# Patient Record
Sex: Female | Born: 1955
Health system: Southern US, Community
[De-identification: ages and names within clinical notes are randomized; demographics above are authoritative.]

## PROBLEM LIST (undated history)

## (undated) DIAGNOSIS — I82409 Acute embolism and thrombosis of unspecified deep veins of unspecified lower extremity: Secondary | ICD-10-CM

## (undated) DIAGNOSIS — K5792 Diverticulitis of intestine, part unspecified, without perforation or abscess without bleeding: Secondary | ICD-10-CM

## (undated) DIAGNOSIS — M1990 Unspecified osteoarthritis, unspecified site: Secondary | ICD-10-CM

## (undated) DIAGNOSIS — J309 Allergic rhinitis, unspecified: Secondary | ICD-10-CM

## (undated) DIAGNOSIS — E559 Vitamin D deficiency, unspecified: Secondary | ICD-10-CM

## (undated) DIAGNOSIS — Z9049 Acquired absence of other specified parts of digestive tract: Secondary | ICD-10-CM

## (undated) DIAGNOSIS — Z860100 Personal history of colon polyps, unspecified: Secondary | ICD-10-CM

## (undated) DIAGNOSIS — Z6841 Body Mass Index (BMI) 40.0 and over, adult: Secondary | ICD-10-CM

## (undated) DIAGNOSIS — C55 Malignant neoplasm of uterus, part unspecified: Secondary | ICD-10-CM

## (undated) DIAGNOSIS — J0100 Acute maxillary sinusitis, unspecified: Secondary | ICD-10-CM

## (undated) DIAGNOSIS — C541 Malignant neoplasm of endometrium: Secondary | ICD-10-CM

## (undated) DIAGNOSIS — E7800 Pure hypercholesterolemia, unspecified: Secondary | ICD-10-CM

## (undated) DIAGNOSIS — J45909 Unspecified asthma, uncomplicated: Secondary | ICD-10-CM

## (undated) DIAGNOSIS — M51369 Other intervertebral disc degeneration, lumbar region without mention of lumbar back pain or lower extremity pain: Secondary | ICD-10-CM

## (undated) DIAGNOSIS — M169 Osteoarthritis of hip, unspecified: Secondary | ICD-10-CM

## (undated) DIAGNOSIS — Z91018 Allergy to other foods: Secondary | ICD-10-CM

## (undated) DIAGNOSIS — M5136 Other intervertebral disc degeneration, lumbar region: Secondary | ICD-10-CM

## (undated) DIAGNOSIS — L989 Disorder of the skin and subcutaneous tissue, unspecified: Secondary | ICD-10-CM

## (undated) DIAGNOSIS — E785 Hyperlipidemia, unspecified: Secondary | ICD-10-CM

## (undated) DIAGNOSIS — G4730 Sleep apnea, unspecified: Secondary | ICD-10-CM

## (undated) HISTORY — DX: Malignant neoplasm of endometrium: C54.1

## (undated) HISTORY — PX: OVARIAN CYST SURGERY: SHX726

## (undated) HISTORY — DX: Vitamin D deficiency, unspecified: E55.9

## (undated) HISTORY — DX: Unspecified osteoarthritis, unspecified site: M19.90

## (undated) HISTORY — DX: Unspecified asthma, uncomplicated: J45.909

## (undated) HISTORY — DX: Pure hypercholesterolemia, unspecified: E78.00

## (undated) HISTORY — DX: Acquired absence of other specified parts of digestive tract: Z90.49

## (undated) HISTORY — DX: Disorder of the skin and subcutaneous tissue, unspecified: L98.9

## (undated) HISTORY — DX: Allergy to other foods: Z91.018

## (undated) HISTORY — PX: OTHER SURGICAL HISTORY: SHX169

## (undated) HISTORY — DX: Malignant neoplasm of uterus, part unspecified: C55

## (undated) HISTORY — PX: CHOLECYSTECTOMY: SHX55

## (undated) HISTORY — DX: Other intervertebral disc degeneration, lumbar region: M51.36

## (undated) HISTORY — DX: Osteoarthritis of hip, unspecified: M16.9

## (undated) HISTORY — DX: Body Mass Index (BMI) 40.0 and over, adult: Z684

## (undated) HISTORY — PX: TUBAL LIGATION: SHX77

## (undated) HISTORY — DX: Other intervertebral disc degeneration, lumbar region without mention of lumbar back pain or lower extremity pain: M51.369

## (undated) HISTORY — PX: SHOULDER SURGERY: SHX246

## (undated) HISTORY — PX: ROBOTIC ASSISTED TOTAL HYSTERECTOMY: SHX6085

## (undated) HISTORY — DX: Personal history of colonic polyps: Z86.010

## (undated) HISTORY — PX: APPENDECTOMY: SHX54

## (undated) HISTORY — DX: Acute embolism and thrombosis of unspecified deep veins of unspecified lower extremity: I82.409

## (undated) HISTORY — DX: Diverticulitis of intestine, part unspecified, without perforation or abscess without bleeding: K57.92

## (undated) HISTORY — DX: Acute maxillary sinusitis, unspecified: J01.00

## (undated) HISTORY — PX: COLON SURGERY: SHX602

## (undated) HISTORY — DX: Allergic rhinitis, unspecified: J30.9

## (undated) HISTORY — DX: Personal history of colon polyps, unspecified: Z86.0100

---
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
## 2015-07-04 DIAGNOSIS — Z8542 Personal history of malignant neoplasm of other parts of uterus: Secondary | ICD-10-CM | POA: Insufficient documentation

## 2015-07-17 ENCOUNTER — Other Ambulatory Visit: Payer: Self-pay

## 2015-07-17 DIAGNOSIS — Z1231 Encounter for screening mammogram for malignant neoplasm of breast: Secondary | ICD-10-CM

## 2015-07-19 ENCOUNTER — Ambulatory Visit
Admission: RE | Admit: 2015-07-19 | Discharge: 2015-07-19 | Disposition: A | Discharge status: Home / Self Care | Payer: BLUE CROSS/BLUE SHIELD | Source: Ambulatory Visit | Attending: Physician Assistant | Admitting: Physician Assistant

## 2015-07-19 ENCOUNTER — Other Ambulatory Visit: Payer: Self-pay | Admitting: Physician Assistant

## 2015-07-19 DIAGNOSIS — M1612 Unilateral primary osteoarthritis, left hip: Secondary | ICD-10-CM

## 2015-07-31 ENCOUNTER — Ambulatory Visit
Admission: RE | Admit: 2015-07-31 | Discharge: 2015-07-31 | Disposition: A | Discharge status: Home / Self Care | Payer: BLUE CROSS/BLUE SHIELD | Source: Ambulatory Visit

## 2015-07-31 DIAGNOSIS — Z1231 Encounter for screening mammogram for malignant neoplasm of breast: Secondary | ICD-10-CM

## 2015-12-07 ENCOUNTER — Other Ambulatory Visit: Payer: Self-pay | Admitting: Physical Medicine and Rehabilitation

## 2015-12-07 DIAGNOSIS — M25552 Pain in left hip: Secondary | ICD-10-CM

## 2015-12-15 ENCOUNTER — Ambulatory Visit
Admission: RE | Admit: 2015-12-15 | Discharge: 2015-12-15 | Disposition: A | Discharge status: Home / Self Care | Payer: BLUE CROSS/BLUE SHIELD | Source: Ambulatory Visit | Attending: Physical Medicine and Rehabilitation | Admitting: Physical Medicine and Rehabilitation

## 2015-12-15 DIAGNOSIS — M25552 Pain in left hip: Secondary | ICD-10-CM

## 2015-12-15 MED ORDER — METHYLPREDNISOLONE ACETATE 40 MG/ML INJ SUSP (RADIOLOG
120.0000 mg | Freq: Once | INTRAMUSCULAR | Status: AC
  Administered 2015-12-15: 120 mg via INTRA_ARTICULAR

## 2015-12-15 MED ORDER — IOPAMIDOL (ISOVUE-M 200) INJECTION 41%
1.0000 mL | Freq: Once | INTRAMUSCULAR | Status: AC
  Administered 2015-12-15: 1 mL via INTRA_ARTICULAR

## 2016-04-02 ENCOUNTER — Other Ambulatory Visit: Payer: Self-pay | Admitting: Orthopaedic Surgery

## 2016-04-02 DIAGNOSIS — M25552 Pain in left hip: Secondary | ICD-10-CM

## 2016-04-12 ENCOUNTER — Ambulatory Visit
Admission: RE | Admit: 2016-04-12 | Discharge: 2016-04-12 | Disposition: A | Discharge status: Home / Self Care | Payer: BLUE CROSS/BLUE SHIELD | Source: Ambulatory Visit | Attending: Orthopaedic Surgery | Admitting: Orthopaedic Surgery

## 2016-04-12 DIAGNOSIS — M25552 Pain in left hip: Secondary | ICD-10-CM

## 2016-04-12 MED ORDER — IOPAMIDOL (ISOVUE-M 200) INJECTION 41%
1.0000 mL | Freq: Once | INTRAMUSCULAR | Status: AC
  Administered 2016-04-12: 1 mL via INTRA_ARTICULAR

## 2016-04-12 MED ORDER — METHYLPREDNISOLONE ACETATE 40 MG/ML INJ SUSP (RADIOLOG
120.0000 mg | Freq: Once | INTRAMUSCULAR | Status: AC
  Administered 2016-04-12: 120 mg via INTRA_ARTICULAR

## 2016-04-25 ENCOUNTER — Other Ambulatory Visit: Payer: Self-pay | Admitting: Orthopaedic Surgery

## 2016-04-25 DIAGNOSIS — M25552 Pain in left hip: Secondary | ICD-10-CM

## 2016-04-25 DIAGNOSIS — M1612 Unilateral primary osteoarthritis, left hip: Secondary | ICD-10-CM

## 2016-06-12 ENCOUNTER — Ambulatory Visit
Admission: RE | Admit: 2016-06-12 | Discharge: 2016-06-12 | Disposition: A | Discharge status: Home / Self Care | Payer: BLUE CROSS/BLUE SHIELD | Source: Ambulatory Visit | Attending: Orthopaedic Surgery | Admitting: Orthopaedic Surgery

## 2016-06-12 DIAGNOSIS — M25552 Pain in left hip: Secondary | ICD-10-CM

## 2016-06-12 MED ORDER — IOPAMIDOL (ISOVUE-M 200) INJECTION 41%
1.0000 mL | Freq: Once | INTRAMUSCULAR | Status: AC
  Administered 2016-06-12: 1 mL via INTRA_ARTICULAR

## 2016-06-12 MED ORDER — METHYLPREDNISOLONE ACETATE 40 MG/ML INJ SUSP (RADIOLOG
120.0000 mg | Freq: Once | INTRAMUSCULAR | Status: AC
  Administered 2016-06-12: 120 mg via INTRA_ARTICULAR

## 2016-08-27 ENCOUNTER — Encounter: Payer: Self-pay | Admitting: Allergy and Immunology

## 2016-08-27 ENCOUNTER — Ambulatory Visit (INDEPENDENT_AMBULATORY_CARE_PROVIDER_SITE_OTHER): Payer: BLUE CROSS/BLUE SHIELD | Admitting: Allergy and Immunology

## 2016-08-27 VITALS — BP 138/82 | HR 88 | Temp 97.3°F | Resp 20 | Ht 66.73 in | Wt 345.6 lb

## 2016-08-27 DIAGNOSIS — Z91018 Allergy to other foods: Secondary | ICD-10-CM

## 2016-08-27 DIAGNOSIS — J4530 Mild persistent asthma, uncomplicated: Secondary | ICD-10-CM

## 2016-08-27 DIAGNOSIS — J3089 Other allergic rhinitis: Secondary | ICD-10-CM | POA: Diagnosis not present

## 2016-08-27 MED ORDER — AUVI-Q 0.3 MG/0.3ML IJ SOAJ: INTRAMUSCULAR | 3 refills | Status: DC

## 2016-08-27 NOTE — Patient Instructions (Addendum)
  1. Allergen avoidance measures? - Check area 2 panel, Fish panel  2. Auvi-Q, (Epi-Pen), Benadryl, M.D./ER evaluation for allergic reaction  3. Use a preventative plan:   A. Flonase 1-2 sprays each nostril one time per day  B. Flovent 110 2 inhalations one time per day  4. If needed:   A. OTC antihistamine  B. Proventil HFA 2 puffs every 4-6 hours  5. "Action plan" for flare up:   A. increase Flovent 110-3 inhalations 3 times a day  B. OTC Mucinex DM 2 tablets twice a day  C. OTC ranitidine 150 mg twice a day  6. Food challenge?  7. Immunotherapy?  8. Return to clinic in 6 months or earlier if problem

## 2016-08-27 NOTE — Progress Notes (Signed)
Dear Dr. Jacelyn Grip,  Thank you for referring Jill Daniels to the [facility] on 08/27/2016.   Below is a summation of this patient's evaluation and recommendations.  Thank you for your referral. I will keep you informed about this patient's response to treatment.   If you have any questions please do not hesitate to contact me.   Sincerely,  Jiles Prows, MD Allergy / [facility]   ______________________________________________________________________    NEW PATIENT NOTE  Referring Provider: Vernie Shanks, MD Primary Provider: Aretta Nip, MD Date of office visit: 08/27/2016    Subjective:   Chief Complaint:  Jill Daniels (DOB: 06-08-1955) is a 61 y.o. female who presents to the clinic on 08/27/2016 with a chief complaint of Allergic Rhinitis  and Asthma .     HPI: Jill Daniels presents to this clinic in evaluation of several issues.  First, she has had several very bad infections with one in October 2017 and one in March 2018 manifested as coughing and ugly phlegm production and nasal congestion and anosmia and posttussive emesis that basically took her out of commission for 2-3 weeks per episode.   Second, she does have a long history of mild asthma that is usually triggered by exposure to perfumes and scents but she rarely uses a short acting bronchodilator and has no difficulty exerting herself. Ever since her last event in March she has been using Flovent on a consistent basis.  Third, she also has a history of nasal congestion and occasional sneezing usually following exposure to the outdoors. This was not a particularly big issue for her but she has been consistently using Flonase since her last flareup in March 2018.  Fourth, she relates a history of developing a bad reaction when eating salmon and swordfish. She remembers events associated with mouth  swelling several years back.  Fifth, she was stung by some type of flying insects while living in [place] as a young girl around the age of 63 and developed a very large local reaction involving her foot and leg and maybe some other symptoms and was evaluated in the hospital setting and she subsequently had skin testing performed which identified hypersensitivity to venom but in 2007 while living in [place] she was re-skin tested again against venom and found to be negative.  Sixth, she states that when she eats pizza with red sauce she will get a rash on her lower extremities within 24 hours without any associated systemic or constitutional symptoms. This rash is not practically pruritic.  Past Medical History:  Diagnosis Date  . Asthma   . Diverticulitis   . DVT (deep venous thrombosis) ([place])   . Food allergy    Fish Allergy  . Osteoarthritis   . Uterine cancer [facility])     Past Surgical History:  Procedure Laterality Date  . APPENDECTOMY    . CHOLECYSTECTOMY    . COLON SURGERY    . OTHER SURGICAL HISTORY     Surgeries to remove scar tissue from ovarian cyst surgery  . OVARIAN CYST SURGERY    . ROBOTIC ASSISTED TOTAL HYSTERECTOMY    . SHOULDER SURGERY    . TUBAL LIGATION      Allergies as of 08/27/2016      Reactions   Clindamycin/lincomycin Nausea Only, Rash   Fish-derived Products Anaphylaxis   Penicillin G Anaphylaxis   Other    Anti-Inflammatories.  Can take Tylenol and Aspirin without a problem.    Erythromycin Nausea Only, Rash      Medication List      BIOTIN PO Take by mouth daily.   EPIPEN 2-PAK 0.3 mg/0.3 mL Soaj injection Generic drug:  EPINEPHrine Use as directed for life-threatening allergic reaction.   FLOVENT HFA 110 MCG/ACT inhaler Generic drug:  fluticasone INL 1 TO 2 PFS PO BID   fluticasone 50 MCG/ACT nasal spray Commonly known as:  FLONASE SHAKE LQ AND U 1 SPR IEN QD   multivitamin tablet Take 1 tablet by mouth daily.   PROVENTIL HFA  108 (90 Base) MCG/ACT inhaler Generic drug:  albuterol INHALE 2 PUFFS Q 4  H PRF RESCUE BREATHING   VITAMIN C PO Take by mouth daily.   VITAMIN D PO Take by mouth daily.       Review of systems negative except as noted in HPI / PMHx or noted below:  Review of Systems  Constitutional: Negative.   HENT: Negative.   Eyes: Negative.   Respiratory: Negative.   Cardiovascular: Negative.   Gastrointestinal: Negative.   Genitourinary: Negative.   Musculoskeletal: Negative.   Skin: Negative.   Neurological: Negative.   Endo/Heme/Allergies: Negative.   Psychiatric/Behavioral: Negative.     Family History  Problem Relation Age of Onset  . Heart attack Mother   . Asthma Father   . Asthma Sister   . Lupus Sister     Social History   Social History  . Marital status: Married    Spouse name: N/A  . Number of children: N/A  . Years of education: N/A   Occupational History  . Not on file.   Social History Main Topics  . Smoking status: Never Smoker  . Smokeless tobacco: Never Used  . Alcohol use No  . Drug use: No  . Sexual activity: Not on file   Other Topics Concern  . Not on file   Social History Narrative  . No narrative on file    Environmental and Social history  Lives in a house with a dry environment, no animals located inside the household, would floor in the bedroom, plastic on both the bed and the pillow, and no smoking ongoing with inside the household. 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  Objective:   Vitals:   08/27/16 1452  BP: 138/82  Pulse: 88  Resp: 20  Temp: 97.3 F (36.3 C)   Height: 5' 6.73" (169.5 cm) Weight: (!) 345 lb 9.6 oz (156.8 kg)  Physical Exam  Constitutional: She is well-developed, well-nourished, and in no distress.  HENT:  Head: Normocephalic. Head is without right periorbital erythema and without left periorbital erythema.  Right Ear: Tympanic membrane, external ear and ear canal normal.  Left Ear:  Tympanic membrane, external ear and ear canal normal.  Nose: Nose normal. No mucosal edema or rhinorrhea.  Mouth/Throat: Uvula is midline, oropharynx is clear and moist and mucous membranes are normal. No oropharyngeal exudate.  Eyes: Conjunctivae and lids are normal. Pupils are equal, round, and reactive to light.  Neck: Trachea normal. No tracheal tenderness present. No tracheal deviation present. No thyromegaly present.  Cardiovascular: Normal rate, regular rhythm, S1 normal, S2 normal and normal heart sounds.   No murmur heard. Pulmonary/Chest: Effort normal and breath sounds normal. No stridor. No tachypnea. No respiratory distress. She has no wheezes. She has no rales. She exhibits no tenderness.  Abdominal: Soft. She exhibits no distension and no mass. There  is no hepatosplenomegaly. There is no tenderness. There is no rebound and no guarding.  Musculoskeletal: She exhibits no edema or tenderness.  Lymphadenopathy:       Head (right side): No tonsillar adenopathy present.       Head (left side): No tonsillar adenopathy present.    She has no cervical adenopathy.    She has no axillary adenopathy.  Neurological: She is alert. Gait normal.  Skin: No rash noted. She is not diaphoretic. No erythema. No pallor. Nails show no clubbing.  Psychiatric: Mood and affect normal.    Diagnostics: Allergy skin tests were performed. She did not demonstrate any hypersensitivity against a screening panel of aeroallergens or foods.  Spirometry was performed and demonstrated an FEV1 of 2.26 @ 84 % of predicted.  Assessment and Plan:    1. Asthma, well controlled, mild persistent   2. Other allergic rhinitis   3. Food allergy     1. Allergen avoidance measures? - Check area 2 panel, Fish panel  2. Auvi-Q, (Epi-Pen), Benadryl, M.D./ER evaluation for allergic reaction  3. Use a preventative plan:   A. Flonase 1-2 sprays each nostril one time per day  B. Flovent 110 2 inhalations one time per  day  4. If needed:   A. OTC antihistamine  B. Proventil HFA 2 puffs every 4-6 hours  5. "Action plan" for flare up:   A. increase Flovent 110-3 inhalations 3 times a day  B. OTC Mucinex DM 2 tablets twice a day  C. OTC ranitidine 150 mg twice a day  6. Food challenge?  7. Immunotherapy?  8. Return to clinic in 6 months or earlier if problem  I think that Jill Daniels does have some degree of inflammation affecting her airway and it would be worthwhile for her to use anti-inflammatory medications preventatively for both her upper and lower airway as noted above. There did not appear to be a tremendous amount of atopic disease identified on her skin testing and we will follow up this issue by obtaining blood tests as noted above. Because she does develop issues with posttussive emesis whenever she develops one of her flareups we will introduce ranitidine during that flareup as part of her action plan. If she does not demonstrate any IgE antibodies directed against Fish we will arrange for an in clinic Fish challenge. I will see her back in this clinic in 6 months or earlier if there is a problem. I'll be contacting her with the results of her blood tests once they are available for review.  Jiles Prows, MD [facility]

## 2016-08-30 LAB — ALLERGENS W/TOTAL IGE AREA 2
Cat Dander IgE: <0.1 kU/L
Cedar, Mountain IgE: <0.1 kU/L
Cockroach, German IgE: <0.1 kU/L
Common Silver Birch IgE: <0.1 kU/L
D Farinae IgE: <0.1 kU/L
D Pteronyssinus IgE: <0.1 kU/L
Elm, American IgE: <0.1 kU/L
IGE (IMMUNOGLOBULIN E), SERUM: 33 [IU]/mL (ref 0–100)
Johnson Grass IgE: <0.1 kU/L
Maple/Box Elder IgE: <0.1 kU/L
Mouse Urine IgE: <0.1 kU/L
Oak, White IgE: <0.1 kU/L
Pecan, Hickory IgE: <0.1 kU/L
Penicillium Chrysogen IgE: <0.1 kU/L
Sheep Sorrel IgE Qn: <0.1 kU/L

## 2016-08-30 LAB — ALLERGEN PROFILE, FOOD-FISH
Allergen Mackerel IgE: <0.1 kU/L
Allergen Walley Pike IgE: <0.1 kU/L
Halibut IgE: <0.1 kU/L
Tuna: <0.1 kU/L

## 2016-09-04 ENCOUNTER — Telehealth: Payer: Self-pay

## 2016-09-04 NOTE — Telephone Encounter (Signed)
Dr. Neldon Mc patient called and wanted to know what her labs stated. Can you please result them.

## 2016-09-19 ENCOUNTER — Other Ambulatory Visit: Payer: Self-pay | Admitting: Orthopaedic Surgery

## 2016-09-19 DIAGNOSIS — M1612 Unilateral primary osteoarthritis, left hip: Secondary | ICD-10-CM

## 2016-09-19 DIAGNOSIS — M25552 Pain in left hip: Secondary | ICD-10-CM

## 2016-10-03 ENCOUNTER — Other Ambulatory Visit: Payer: Self-pay | Admitting: Family Medicine

## 2016-10-03 DIAGNOSIS — Z1231 Encounter for screening mammogram for malignant neoplasm of breast: Secondary | ICD-10-CM

## 2016-10-04 ENCOUNTER — Ambulatory Visit
Admission: RE | Admit: 2016-10-04 | Discharge: 2016-10-04 | Disposition: A | Discharge status: Home / Self Care | Payer: BLUE CROSS/BLUE SHIELD | Source: Ambulatory Visit | Attending: Orthopaedic Surgery | Admitting: Orthopaedic Surgery

## 2016-10-04 DIAGNOSIS — M1612 Unilateral primary osteoarthritis, left hip: Secondary | ICD-10-CM

## 2016-10-04 DIAGNOSIS — M25552 Pain in left hip: Secondary | ICD-10-CM

## 2016-10-04 MED ORDER — METHYLPREDNISOLONE ACETATE 40 MG/ML INJ SUSP (RADIOLOG
120.0000 mg | Freq: Once | INTRAMUSCULAR | Status: AC
  Administered 2016-10-04: 120 mg via INTRA_ARTICULAR

## 2016-10-04 MED ORDER — IOPAMIDOL (ISOVUE-M 200) INJECTION 41%
1.0000 mL | Freq: Once | INTRAMUSCULAR | Status: AC
  Administered 2016-10-04: 1 mL via INTRA_ARTICULAR

## 2016-10-04 NOTE — Discharge Instructions (Signed)

## 2016-10-15 ENCOUNTER — Ambulatory Visit
Admission: RE | Admit: 2016-10-15 | Discharge: 2016-10-15 | Disposition: A | Discharge status: Home / Self Care | Payer: BLUE CROSS/BLUE SHIELD | Source: Ambulatory Visit | Attending: Family Medicine | Admitting: Family Medicine

## 2016-10-15 ENCOUNTER — Encounter: Payer: Self-pay | Admitting: Radiology

## 2016-10-15 DIAGNOSIS — Z1231 Encounter for screening mammogram for malignant neoplasm of breast: Secondary | ICD-10-CM

## 2016-10-17 ENCOUNTER — Other Ambulatory Visit: Payer: Self-pay | Admitting: Family Medicine

## 2016-10-17 DIAGNOSIS — R928 Other abnormal and inconclusive findings on diagnostic imaging of breast: Secondary | ICD-10-CM

## 2016-10-23 ENCOUNTER — Ambulatory Visit
Admission: RE | Admit: 2016-10-23 | Discharge: 2016-10-23 | Disposition: A | Discharge status: Home / Self Care | Payer: BLUE CROSS/BLUE SHIELD | Source: Ambulatory Visit | Attending: Family Medicine | Admitting: Family Medicine

## 2016-10-23 DIAGNOSIS — R928 Other abnormal and inconclusive findings on diagnostic imaging of breast: Secondary | ICD-10-CM

## 2017-03-03 ENCOUNTER — Other Ambulatory Visit: Payer: Self-pay | Admitting: Orthopaedic Surgery

## 2017-03-03 DIAGNOSIS — M25552 Pain in left hip: Secondary | ICD-10-CM

## 2017-03-28 ENCOUNTER — Ambulatory Visit
Admission: RE | Admit: 2017-03-28 | Discharge: 2017-03-28 | Disposition: A | Discharge status: Home / Self Care | Payer: BLUE CROSS/BLUE SHIELD | Source: Ambulatory Visit | Attending: Orthopaedic Surgery | Admitting: Orthopaedic Surgery

## 2017-03-28 DIAGNOSIS — M25552 Pain in left hip: Secondary | ICD-10-CM

## 2017-03-28 MED ORDER — METHYLPREDNISOLONE ACETATE 40 MG/ML INJ SUSP (RADIOLOG
120.0000 mg | Freq: Once | INTRAMUSCULAR | Status: AC
  Administered 2017-03-28: 120 mg via INTRA_ARTICULAR

## 2017-03-28 MED ORDER — IOPAMIDOL (ISOVUE-M 200) INJECTION 41%
1.0000 mL | Freq: Once | INTRAMUSCULAR | Status: AC
  Administered 2017-03-28: 1 mL via INTRA_ARTICULAR

## 2017-07-22 ENCOUNTER — Other Ambulatory Visit: Payer: Self-pay | Admitting: Physical Medicine and Rehabilitation

## 2017-07-22 DIAGNOSIS — M1612 Unilateral primary osteoarthritis, left hip: Secondary | ICD-10-CM

## 2017-07-22 DIAGNOSIS — M25552 Pain in left hip: Secondary | ICD-10-CM

## 2017-08-05 ENCOUNTER — Ambulatory Visit
Admission: RE | Admit: 2017-08-05 | Discharge: 2017-08-05 | Disposition: A | Discharge status: Home / Self Care | Payer: BLUE CROSS/BLUE SHIELD | Source: Ambulatory Visit | Attending: Physical Medicine and Rehabilitation | Admitting: Physical Medicine and Rehabilitation

## 2017-08-05 DIAGNOSIS — M25552 Pain in left hip: Secondary | ICD-10-CM

## 2017-08-05 DIAGNOSIS — M1612 Unilateral primary osteoarthritis, left hip: Secondary | ICD-10-CM

## 2017-08-05 MED ORDER — METHYLPREDNISOLONE ACETATE 40 MG/ML INJ SUSP (RADIOLOG
120.0000 mg | Freq: Once | INTRAMUSCULAR | Status: AC
  Administered 2017-08-05: 120 mg via INTRA_ARTICULAR

## 2017-08-05 MED ORDER — IOPAMIDOL (ISOVUE-M 200) INJECTION 41%
1.0000 mL | Freq: Once | INTRAMUSCULAR | Status: AC
  Administered 2017-08-05: 1 mL via INTRA_ARTICULAR

## 2017-09-12 ENCOUNTER — Other Ambulatory Visit: Payer: Self-pay | Admitting: Family Medicine

## 2017-09-12 DIAGNOSIS — Z1231 Encounter for screening mammogram for malignant neoplasm of breast: Secondary | ICD-10-CM

## 2017-10-24 ENCOUNTER — Ambulatory Visit: Payer: BLUE CROSS/BLUE SHIELD

## 2017-11-05 ENCOUNTER — Ambulatory Visit: Payer: BLUE CROSS/BLUE SHIELD

## 2017-12-04 ENCOUNTER — Ambulatory Visit
Admission: RE | Admit: 2017-12-04 | Discharge: 2017-12-04 | Disposition: A | Discharge status: Home / Self Care | Payer: BLUE CROSS/BLUE SHIELD | Source: Ambulatory Visit | Attending: Family Medicine | Admitting: Family Medicine

## 2017-12-04 DIAGNOSIS — Z1231 Encounter for screening mammogram for malignant neoplasm of breast: Secondary | ICD-10-CM

## 2017-12-08 ENCOUNTER — Other Ambulatory Visit: Payer: Self-pay | Admitting: Physical Medicine and Rehabilitation

## 2017-12-08 DIAGNOSIS — M25552 Pain in left hip: Secondary | ICD-10-CM

## 2018-01-13 ENCOUNTER — Ambulatory Visit
Admission: RE | Admit: 2018-01-13 | Discharge: 2018-01-13 | Disposition: A | Discharge status: Home / Self Care | Payer: BLUE CROSS/BLUE SHIELD | Source: Ambulatory Visit | Attending: Physical Medicine and Rehabilitation | Admitting: Physical Medicine and Rehabilitation

## 2018-01-13 DIAGNOSIS — M25552 Pain in left hip: Secondary | ICD-10-CM

## 2018-01-13 MED ORDER — METHYLPREDNISOLONE ACETATE 40 MG/ML INJ SUSP (RADIOLOG
120.0000 mg | Freq: Once | INTRAMUSCULAR | Status: AC
  Administered 2018-01-13: 120 mg via INTRA_ARTICULAR

## 2018-01-13 MED ORDER — IOPAMIDOL (ISOVUE-M 200) INJECTION 41%
1.0000 mL | Freq: Once | INTRAMUSCULAR | Status: AC
  Administered 2018-01-13: 1 mL via INTRA_ARTICULAR

## 2019-01-12 ENCOUNTER — Other Ambulatory Visit: Payer: Self-pay | Admitting: Family Medicine

## 2019-01-12 DIAGNOSIS — Z1231 Encounter for screening mammogram for malignant neoplasm of breast: Secondary | ICD-10-CM

## 2019-02-26 ENCOUNTER — Other Ambulatory Visit: Payer: Self-pay

## 2019-02-26 ENCOUNTER — Ambulatory Visit
Admission: RE | Admit: 2019-02-26 | Discharge: 2019-02-26 | Disposition: A | Discharge status: Home / Self Care | Payer: BC Managed Care – PPO | Source: Ambulatory Visit | Attending: Family Medicine | Admitting: Family Medicine

## 2019-02-26 DIAGNOSIS — Z1231 Encounter for screening mammogram for malignant neoplasm of breast: Secondary | ICD-10-CM

## 2019-08-09 ENCOUNTER — Other Ambulatory Visit (HOSPITAL_BASED_OUTPATIENT_CLINIC_OR_DEPARTMENT_OTHER): Payer: Self-pay | Admitting: Family Medicine

## 2019-08-09 DIAGNOSIS — R109 Unspecified abdominal pain: Secondary | ICD-10-CM

## 2019-08-10 ENCOUNTER — Ambulatory Visit (HOSPITAL_BASED_OUTPATIENT_CLINIC_OR_DEPARTMENT_OTHER)
Admission: RE | Admit: 2019-08-10 | Discharge: 2019-08-10 | Disposition: A | Discharge status: Home / Self Care | Payer: BC Managed Care – PPO | Source: Ambulatory Visit | Attending: Family Medicine | Admitting: Family Medicine

## 2019-08-10 ENCOUNTER — Encounter (HOSPITAL_BASED_OUTPATIENT_CLINIC_OR_DEPARTMENT_OTHER): Payer: Self-pay

## 2019-08-10 ENCOUNTER — Other Ambulatory Visit: Payer: Self-pay

## 2019-08-10 DIAGNOSIS — R109 Unspecified abdominal pain: Secondary | ICD-10-CM | POA: Insufficient documentation

## 2019-08-10 MED ORDER — IOHEXOL 300 MG/ML  SOLN
100.0000 mL | Freq: Once | INTRAMUSCULAR | Status: AC | PRN
  Administered 2019-08-10: 100 mL via INTRAVENOUS

## 2019-09-08 ENCOUNTER — Other Ambulatory Visit: Payer: Self-pay

## 2019-09-08 ENCOUNTER — Encounter: Payer: Self-pay | Admitting: Cardiology

## 2019-09-08 ENCOUNTER — Ambulatory Visit: Payer: BC Managed Care – PPO | Admitting: Cardiology

## 2019-09-08 VITALS — BP 130/74 | HR 92 | Ht 67.5 in | Wt 291.0 lb

## 2019-09-08 DIAGNOSIS — I2510 Atherosclerotic heart disease of native coronary artery without angina pectoris: Secondary | ICD-10-CM

## 2019-09-08 DIAGNOSIS — E7800 Pure hypercholesterolemia, unspecified: Secondary | ICD-10-CM

## 2019-09-08 DIAGNOSIS — I2584 Coronary atherosclerosis due to calcified coronary lesion: Secondary | ICD-10-CM | POA: Diagnosis not present

## 2019-09-08 NOTE — Progress Notes (Signed)
Cardiology Consult Note    Date:  09/08/2019   ID:  Jill Daniels, DOB 05-16-1955, MRN [id number]  PCP:  Cari Caraway, MD  Cardiologist:  Fransico Him, MD   Chief Complaint  Patient presents with  . New Patient (Initial Visit)    coronary artery calcifications    History of Present Illness:  Jill Daniels is a 64 y.o. female who is being seen today for the evaluation of coronary calcifications noted on chest CT at the request of Cari Caraway, MD.  This is a 64yo female with a hx of morbid obesity, HLD and fm hx of CAD who recently had a abdominal  CT done for workup of abdominal pain and CT showed  for coronary artery calcifications in the LCX and RCA and aortic atherosclerosis.  She is now referred for further cardiac risk assessment.   She is here today for followup and is doing well.  She denies any chest pain or pressure, SOB, DOE, PND, orthopnea, LE edema, dizziness, palpitations or syncope. She is compliant with her meds and is tolerating meds with no SE.    Past Medical History:  Diagnosis Date  . Acute abscess of maxillary sinus   . Allergic rhinitis   . Asthma   . BMI 50.0-59.9, adult ([facility])   . Carcinoma of endometrium ([facility])   . DDD (degenerative disc disease), lumbar   . Diverticulitis   . DVT (deep venous thrombosis) ([facility])   . Dyslipidemia   . Food allergy    Fish Allergy  . History of partial colectomy   . Hypercholesteremia   . Hypercholesterolemia   . Morbid obesity ([facility])   . Osteoarthritis   . Osteoarthritis of hip, unspecified   . Personal history of colonic polyps   . Skin lesions   . Sleep apnea in adult   . Uterine cancer ([facility])   . Vitamin D deficiency     Past Surgical History:  Procedure Laterality Date  . APPENDECTOMY    . CHOLECYSTECTOMY    . COLON SURGERY    . OTHER SURGICAL HISTORY     Surgeries to remove scar tissue from ovarian cyst surgery  . OVARIAN CYST SURGERY    . ROBOTIC ASSISTED TOTAL HYSTERECTOMY    . SHOULDER  SURGERY    . TUBAL LIGATION      Current Medications: Current Meds  Medication Sig  . Ascorbic Acid (VITAMIN C PO) Take by mouth daily.  Marland Kitchen atorvastatin (LIPITOR) 20 MG tablet Take 20 mg by mouth daily.  Marland Kitchen AUVI-Q 0.3 MG/0.3ML SOAJ injection Use as directed for life threatening allergic reactions  . BIOTIN PO Take by mouth daily.  Marland Kitchen Black Elderberry (SAMBUCUS ELDERBERRY) 50 MG/5ML SYRP Take by mouth.  . Cholecalciferol (VITAMIN D PO) Take by mouth daily.  Marland Kitchen EPINEPHrine (EPIPEN 2-PAK) 0.3 mg/0.3 mL IJ SOAJ injection Use as directed for life-threatening allergic reaction.  Marland Kitchen FLOVENT HFA 110 MCG/ACT inhaler INL 1 TO 2 PFS PO BID  . fluticasone (FLONASE) 50 MCG/ACT nasal spray SHAKE LQ AND U 1 SPR IEN QD  . Multiple Vitamin (MULTIVITAMIN) tablet Take 1 tablet by mouth daily.  Marland Kitchen PROVENTIL HFA 108 (90 Base) MCG/ACT inhaler INHALE 2 PUFFS Q 4  H PRF RESCUE BREATHING  . sulfamethoxazole-trimethoprim (BACTRIM DS) 800-160 MG tablet Take 1 tablet by mouth 2 (two) times daily.  . [DISCONTINUED] metroNIDAZOLE (FLAGYL) 500 MG tablet Take 500 mg by mouth 3 (three) times daily.    Allergies:   Fish-derived products, Penicillin g,  Penicillins, Bee venom, Other, Clindamycin/lincomycin, Erythromycin, and Tape   Social History   Socioeconomic History  . Marital status: Married    Spouse name: Not on file  . Number of children: Not on file  . Years of education: Not on file  . Highest education level: Not on file  Occupational History  . Not on file  Tobacco Use  . Smoking status: Never Smoker  . Smokeless tobacco: Never Used  Substance and Sexual Activity  . Alcohol use: No  . Drug use: No  . Sexual activity: Not on file  Other Topics Concern  . Not on file  Social History Narrative  . Not on file   Social Determinants of Health   Financial Resource Strain:   . Difficulty of Paying Living Expenses:   Food Insecurity:   . Worried About Charity fundraiser in the Last Year:   . Academic librarian in the Last Year:   Transportation Needs:   . Film/video editor (Medical):   Marland Kitchen Lack of Transportation (Non-Medical):   Physical Activity:   . Days of Exercise per Week:   . Minutes of Exercise per Session:   Stress:   . Feeling of Stress :   Social Connections:   . Frequency of Communication with Friends and Family:   . Frequency of Social Gatherings with Friends and Family:   . Attends Religious Services:   . Active Member of Clubs or Organizations:   . Attends Archivist Meetings:   Marland Kitchen Marital Status:      Family History:  The patient's family history includes Asthma in her father and sister; CAD in her father; Heart attack in her mother; Lupus in her sister.   ROS:   Please see the history of present illness.    ROS All other systems reviewed and are negative.  No flowsheet data found.   PHYSICAL EXAM:   VS:  BP 130/74   Pulse 92   Ht 5' 7.5" (1.715 m)   Wt 291 lb (132 kg)   SpO2 97%   BMI 44.90 kg/m    GEN: Well nourished, well developed, in no acute distress  HEENT: normal  Neck: no JVD, carotid bruits, or masses Cardiac: RRR; no murmurs, rubs, or gallops,no edema.  Intact distal pulses bilaterally.  Respiratory:  clear to auscultation bilaterally, normal work of breathing GI: soft, nontender, nondistended, + BS MS: no deformity or atrophy  Skin: warm and dry, no rash Neuro:  Alert and Oriented x 3, Strength and sensation are intact Psych: euthymic mood, full affect  Wt Readings from Last 3 Encounters:  09/08/19 291 lb (132 kg)  08/27/16 (!) 345 lb 9.6 oz (156.8 kg)      Studies/Labs Reviewed:   EKG:  EKG is ordered today.  The ekg ordered today demonstrates NSR with no ST changes  Recent Labs: No results found for requested labs within last 8760 hours.   Lipid Panel No results found for: CHOL, TRIG, HDL, CHOLHDL, VLDL, LDLCALC, LDLDIRECT  Additional studies/ records that were reviewed today include:  OV notes from PCP and  EKG    ASSESSMENT:    1. Coronary artery calcification   2. Pure hypercholesterolemia   3. Morbid obesity ([place])      PLAN:  In order of problems listed above:  1. Coronary artery calcifications -noted on recent abdominal CT for workup of abdominal pain -she denies any anginal sx -she does have CRFs including morbid obesity, HLD  and fm hx of CAD -I will get a coronary calcium score to help with risk assessment -Lexiscan myoview to rule out iscehmai  2.  HLD -followed by PCP -last LDL was 164 and HDL 56 -LDL goal < 70 given coronary calcificaitons  3.  Morbid obesity -she has been referred to healthy weight and wellness clinic -I have encouraged her to cut back on carbs and portions.  -she is having hip problems and will need a hip replacement once she has lot some more weight -she exercises on a stationary bike   Medication Adjustments/Labs and Tests Ordered: Current medicines are reviewed at length with the patient today.  Concerns regarding medicines are outlined above.  Medication changes, Labs and Tests ordered today are listed in the Patient Instructions below.  There are no Patient Instructions on file for this visit.   Signed, Fransico Him, MD  09/08/2019 1:47 PM    [facility] [place], [place], [place]  [address] Phone: [phone]; Fax: [phone]

## 2019-09-08 NOTE — Patient Instructions (Signed)
Medication Instructions:  Your physician recommends that you continue on your current medications as directed. Please refer to the Current Medication list given to you today.  *If you need a refill on your cardiac medications before your next appointment, please call your pharmacy*  Testing/Procedures: Your physician has requested that you have a lexiscan myoview. For further information please visit [email or web address]. Please follow instruction sheet, as given.  Your physician has recommended that you have a Calcium Score CT Scan.  Follow-Up: At [facility], you and your health needs are our priority.  As part of our continuing mission to provide you with exceptional heart care, we have created designated Provider Care Teams.  These Care Teams include your primary Cardiologist (physician) and Advanced Practice Providers (APPs -  Physician Assistants and Nurse Practitioners) who all work together to provide you with the care you need, when you need it.  Follow up with Dr. Radford Pax as needed based on results of testing.    Other Instructions:  Coronary Calcium Scan A coronary calcium scan is an imaging test used to look for deposits of plaque in the inner lining of the blood vessels of the heart (coronary arteries). Plaque is made up of calcium, protein, and fatty substances. These deposits of plaque can partly clog and narrow the coronary arteries without producing any symptoms or warning signs. This puts a person at risk for a heart attack. This test is recommended for people who are at moderate risk for heart disease. The test can find plaque deposits before symptoms develop. Tell a health care provider about:  Any allergies you have.  All medicines you are taking, including vitamins, herbs, eye drops, creams, and over-the-counter medicines.  Any problems you or family members have had with anesthetic medicines.  Any blood disorders you have.  Any surgeries you have had.  Any  medical conditions you have.  Whether you are pregnant or may be pregnant. What are the risks? Generally, this is a safe procedure. However, problems may occur, including:  Harm to a pregnant woman and her unborn baby. This test involves the use of radiation. Radiation exposure can be dangerous to a pregnant woman and her unborn baby. If you are pregnant or think you may be pregnant, you should not have this procedure done.  Slight increase in the risk of cancer. This is because of the radiation involved in the test. What happens before the procedure? Ask your health care provider for any specific instructions on how to prepare for this procedure. You may be asked to avoid products that contain caffeine, tobacco, or nicotine for 4 hours before the procedure. What happens during the procedure?   You will undress and remove any jewelry from your neck or chest.  You will put on a hospital gown.  Sticky electrodes will be placed on your chest. The electrodes will be connected to an electrocardiogram (ECG) machine to record a tracing of the electrical activity of your heart.  You will lie down on a curved bed that is attached to the [facility].  You may be given medicine to slow down your heart rate so that clear pictures can be created.  You will be moved into the CT scanner, and the CT scanner will take pictures of your heart. During this time, you will be asked to lie still and hold your breath for 2-3 seconds at a time while each picture of your heart is being taken. The procedure may vary among health care providers and hospitals.  What happens after the procedure?  You can get dressed.  You can return to your normal activities.  It is up to you to get the results of your procedure. Ask your health care provider, or the department that is doing the procedure, when your results will be ready. Summary  A coronary calcium scan is an imaging test used to look for deposits of plaque in  the inner lining of the blood vessels of the heart (coronary arteries). Plaque is made up of calcium, protein, and fatty substances.  Generally, this is a safe procedure. Tell your health care provider if you are pregnant or may be pregnant.  Ask your health care provider for any specific instructions on how to prepare for this procedure.  A CT scanner will take pictures of your heart.  You can return to your normal activities after the scan is done. This information is not intended to replace advice given to you by your health care provider. Make sure you discuss any questions you have with your health care provider. Document Revised: 10/20/2018 Document Reviewed: 10/20/2018 Elsevier Patient Education  August.

## 2019-09-15 ENCOUNTER — Encounter (HOSPITAL_COMMUNITY): Payer: Self-pay

## 2019-09-15 DIAGNOSIS — I2584 Coronary atherosclerosis due to calcified coronary lesion: Secondary | ICD-10-CM

## 2019-09-15 DIAGNOSIS — I2510 Atherosclerotic heart disease of native coronary artery without angina pectoris: Secondary | ICD-10-CM

## 2019-09-20 ENCOUNTER — Ambulatory Visit (INDEPENDENT_AMBULATORY_CARE_PROVIDER_SITE_OTHER)
Admission: RE | Admit: 2019-09-20 | Discharge: 2019-09-20 | Disposition: A | Discharge status: Home / Self Care | Payer: Self-pay | Source: Ambulatory Visit | Attending: Cardiology | Admitting: Cardiology

## 2019-09-20 ENCOUNTER — Telehealth (HOSPITAL_COMMUNITY): Payer: Self-pay | Admitting: Cardiology

## 2019-09-20 ENCOUNTER — Other Ambulatory Visit: Payer: Self-pay

## 2019-09-20 DIAGNOSIS — I2584 Coronary atherosclerosis due to calcified coronary lesion: Secondary | ICD-10-CM

## 2019-09-20 DIAGNOSIS — I2510 Atherosclerotic heart disease of native coronary artery without angina pectoris: Secondary | ICD-10-CM

## 2019-09-20 NOTE — Telephone Encounter (Signed)
Patient returned my call this am about scheduling her [facility].  She does not really want to have the Medicine and states she may have an option for an exercise bike. I informed her we do not provide this test at our location.  She has many questions about the Lexiscan and is going to put in a call to Dr. Heron Nay for answers to questions. Order has been deferred for 7 days til pt or Dr. Radford Pax decides what is best for patient at this time.

## 2019-09-21 ENCOUNTER — Other Ambulatory Visit: Payer: Self-pay | Admitting: Gastroenterology

## 2019-09-21 ENCOUNTER — Telehealth: Payer: Self-pay | Admitting: Cardiology

## 2019-09-21 DIAGNOSIS — E7800 Pure hypercholesterolemia, unspecified: Secondary | ICD-10-CM

## 2019-09-21 NOTE — Telephone Encounter (Signed)
I spoke to the patient and informed her that I would have Carly contact her 6/9 morning to further discuss results and stress test options.  She verbalized understanding and would like a call early morning.

## 2019-09-21 NOTE — Telephone Encounter (Signed)
New Message:   Pt saw her CT results yesterday. She would like to have an in person visit or Virtual Visit with Dr Radford Pax asap- She have questions about her CT, also wants t o discuss Stress Test and what are the future plans. She is moving in 6 weeks and very anxious.

## 2019-09-22 ENCOUNTER — Telehealth: Payer: Self-pay

## 2019-09-22 MED ORDER — ATORVASTATIN CALCIUM 40 MG PO TABS
40.0000 mg | ORAL_TABLET | Freq: Every day | ORAL | 3 refills | Status: DC
Start: 2019-09-22 — End: 2019-11-01

## 2019-09-22 NOTE — Telephone Encounter (Signed)
Jill Margarita, MD  09/20/2019 9:32 PM EDT    Coronary calcium score is elevated - need LDL goal < 70. Increase Lipitor to 40mg  daily and repeat FLP and ALT in 6 weeks. Needs stress test to rule out ischemia given high calcium score   Spoke with the patient and went over results. Patient verbalized understanding. She will increase Lipitor to 40 mg daily. She will repeat lab work on 07/16.  Patient is very hesitant about Lexiscan myoview. She understands that she needs to have stress test done and is willing to follow through. She states that she would prefer to have a treadmill stress test but is worried because she has a bad hip. She is going to PT today and is going to see how she feels.  She will call our office this afternoon once she decides if she is going to go through with [employer] or not. Patient states that she is moving to [place] on 07/19 so she is trying to have all testing done prior to this. She also wanted an appt with Dr. Radford Pax before she leaves. I have set her up for a virtual visit on 06/30.

## 2019-09-22 NOTE — Telephone Encounter (Signed)
  Patient Consent for Virtual Visit         Jill Daniels has provided verbal consent on 09/22/2019 for a virtual visit (video or telephone).   CONSENT FOR VIRTUAL VISIT FOR:  Jill Daniels  By participating in this virtual visit I agree to the following:  I hereby voluntarily request, consent and authorize [facility] and its employed or contracted physicians, physician assistants, nurse practitioners or other licensed health care professionals (the Practitioner), to provide me with telemedicine health care services (the "Services") as deemed necessary by the treating Practitioner. I acknowledge and consent to receive the Services by the Practitioner via telemedicine. I understand that the telemedicine visit will involve communicating with the Practitioner through live audiovisual communication technology and the disclosure of certain medical information by electronic transmission. I acknowledge that I have been given the opportunity to request an in-person assessment or other available alternative prior to the telemedicine visit and am voluntarily participating in the telemedicine visit.  I understand that I have the right to withhold or withdraw my consent to the use of telemedicine in the course of my care at any time, without affecting my right to future care or treatment, and that the Practitioner or I may terminate the telemedicine visit at any time. I understand that I have the right to inspect all information obtained and/or recorded in the course of the telemedicine visit and may receive copies of available information for a reasonable fee.  I understand that some of the potential risks of receiving the Services via telemedicine include:  Marland Kitchen Delay or interruption in medical evaluation due to technological equipment failure or disruption; . Information transmitted may not be sufficient (e.g. poor resolution of images) to allow for appropriate medical decision making by the Practitioner;  and/or  . In rare instances, security protocols could fail, causing a breach of personal health information.  Furthermore, I acknowledge that it is my responsibility to provide information about my medical history, conditions and care that is complete and accurate to the best of my ability. I acknowledge that Practitioner's advice, recommendations, and/or decision may be based on factors not within their control, such as incomplete or inaccurate data provided by me or distortions of diagnostic images or specimens that may result from electronic transmissions. I understand that the practice of medicine is not an exact science and that Practitioner makes no warranties or guarantees regarding treatment outcomes. I acknowledge that a copy of this consent can be made available to me via my patient portal ([facility]), or I can request a printed copy by calling the office of [facility].    I understand that my insurance will be billed for this visit.   I have read or had this consent read to me. . I understand the contents of this consent, which adequately explains the benefits and risks of the Services being provided via telemedicine.  . I have been provided ample opportunity to ask questions regarding this consent and the Services and have had my questions answered to my satisfaction. . I give my informed consent for the services to be provided through the use of telemedicine in my medical care

## 2019-10-07 ENCOUNTER — Other Ambulatory Visit (HOSPITAL_COMMUNITY)
Admission: RE | Admit: 2019-10-07 | Discharge: 2019-10-07 | Disposition: A | Discharge status: Home / Self Care | Payer: BC Managed Care – PPO | Source: Ambulatory Visit | Attending: Gastroenterology | Admitting: Gastroenterology

## 2019-10-07 DIAGNOSIS — Z01812 Encounter for preprocedural laboratory examination: Secondary | ICD-10-CM | POA: Diagnosis not present

## 2019-10-07 DIAGNOSIS — Z20822 Contact with and (suspected) exposure to covid-19: Secondary | ICD-10-CM | POA: Diagnosis not present

## 2019-10-07 LAB — SARS CORONAVIRUS 2 (TAT 6-24 HRS): SARS Coronavirus 2: NEGATIVE

## 2019-10-10 NOTE — Anesthesia Preprocedure Evaluation (Addendum)
Anesthesia Evaluation  Patient identified by MRN, date of birth, ID band Patient awake    Reviewed: Allergy & Precautions, NPO status , Patient's Chart, lab work & pertinent test results  Airway Mallampati: II  TM Distance: >3 FB Neck ROM: Full    Dental no notable dental hx.    Pulmonary asthma (controlled) ,    Pulmonary exam normal breath sounds clear to auscultation       Cardiovascular + DVT  Normal cardiovascular exam Rhythm:Regular Rate:Normal     Neuro/Psych negative neurological ROS  negative psych ROS   GI/Hepatic negative GI ROS, Neg liver ROS,   Endo/Other  Morbid obesity  Renal/GU negative Renal ROS     Musculoskeletal  (+) Arthritis ,   Abdominal (+) + obese,   Peds  Hematology HLD   Anesthesia Other Findings abnormal x ray  Reproductive/Obstetrics                            Anesthesia Physical Anesthesia Plan  ASA: III  Anesthesia Plan: General   Post-op Pain Management:    Induction: Intravenous  PONV Risk Score and Plan: 3 and Ondansetron, Dexamethasone and Treatment may vary due to age or medical condition  Airway Management Planned: Oral ETT  Additional Equipment:   Intra-op Plan:   Post-operative Plan: Extubation in OR  Informed Consent: I have reviewed the patients History and Physical, chart, labs and discussed the procedure including the risks, benefits and alternatives for the proposed anesthesia with the patient or authorized representative who has indicated his/her understanding and acceptance.     Dental advisory given  Plan Discussed with: CRNA  Anesthesia Plan Comments:         Anesthesia Quick Evaluation

## 2019-10-11 ENCOUNTER — Encounter (HOSPITAL_COMMUNITY): Payer: Self-pay | Admitting: Gastroenterology

## 2019-10-11 ENCOUNTER — Other Ambulatory Visit: Payer: Self-pay

## 2019-10-11 ENCOUNTER — Ambulatory Visit (HOSPITAL_COMMUNITY)
Admission: RE | Admit: 2019-10-11 | Discharge: 2019-10-11 | Disposition: A | Discharge status: Home / Self Care | Payer: BC Managed Care – PPO | Attending: Gastroenterology | Admitting: Gastroenterology

## 2019-10-11 ENCOUNTER — Ambulatory Visit (HOSPITAL_COMMUNITY): Payer: BC Managed Care – PPO | Admitting: Anesthesiology

## 2019-10-11 ENCOUNTER — Encounter (HOSPITAL_COMMUNITY)
Admission: RE | Disposition: A | Discharge status: Home / Self Care | Payer: Self-pay | Source: Home / Self Care | Attending: Gastroenterology

## 2019-10-11 ENCOUNTER — Ambulatory Visit (HOSPITAL_COMMUNITY): Payer: BC Managed Care – PPO

## 2019-10-11 DIAGNOSIS — K838 Other specified diseases of biliary tract: Secondary | ICD-10-CM | POA: Insufficient documentation

## 2019-10-11 DIAGNOSIS — Z86718 Personal history of other venous thrombosis and embolism: Secondary | ICD-10-CM | POA: Insufficient documentation

## 2019-10-11 DIAGNOSIS — K831 Obstruction of bile duct: Secondary | ICD-10-CM

## 2019-10-11 DIAGNOSIS — Z6841 Body Mass Index (BMI) 40.0 and over, adult: Secondary | ICD-10-CM | POA: Diagnosis not present

## 2019-10-11 DIAGNOSIS — R9389 Abnormal findings on diagnostic imaging of other specified body structures: Secondary | ICD-10-CM

## 2019-10-11 HISTORY — PX: ERCP: SHX5425

## 2019-10-11 HISTORY — PX: BILIARY BRUSHING: SHX6843

## 2019-10-11 SURGERY — ERCP, WITH INTERVENTION IF INDICATED
Anesthesia: General

## 2019-10-11 MED ORDER — MIDAZOLAM HCL 2 MG/2ML IJ SOLN
INTRAMUSCULAR | Status: AC
  Filled 2019-10-11: qty 2

## 2019-10-11 MED ORDER — ROCURONIUM BROMIDE 10 MG/ML (PF) SYRINGE
PREFILLED_SYRINGE | INTRAVENOUS | Status: DC | PRN
  Administered 2019-10-11: 60 mg via INTRAVENOUS

## 2019-10-11 MED ORDER — PROPOFOL 10 MG/ML IV BOLUS
INTRAVENOUS | Status: DC | PRN
  Administered 2019-10-11: 180 mg via INTRAVENOUS

## 2019-10-11 MED ORDER — CIPROFLOXACIN IN D5W 400 MG/200ML IV SOLN
INTRAVENOUS | Status: AC
  Filled 2019-10-11: qty 200

## 2019-10-11 MED ORDER — SUGAMMADEX SODIUM 500 MG/5ML IV SOLN
INTRAVENOUS | Status: DC | PRN
Start: 2019-10-11 — End: 2019-10-11
  Administered 2019-10-11: 500 mg via INTRAVENOUS

## 2019-10-11 MED ORDER — ONDANSETRON HCL 4 MG/2ML IJ SOLN
INTRAMUSCULAR | Status: DC | PRN
  Administered 2019-10-11: 4 mg via INTRAVENOUS

## 2019-10-11 MED ORDER — LACTATED RINGERS IV SOLN
INTRAVENOUS | Status: DC | PRN
Start: 2019-10-11 — End: 2019-10-11

## 2019-10-11 MED ORDER — FENTANYL CITRATE (PF) 100 MCG/2ML IJ SOLN
INTRAMUSCULAR | Status: DC | PRN
  Administered 2019-10-11: 100 ug via INTRAVENOUS

## 2019-10-11 MED ORDER — FENTANYL CITRATE (PF) 100 MCG/2ML IJ SOLN
INTRAMUSCULAR | Status: AC
  Filled 2019-10-11: qty 2

## 2019-10-11 MED ORDER — LIDOCAINE 2% (20 MG/ML) 5 ML SYRINGE
INTRAMUSCULAR | Status: DC | PRN
  Administered 2019-10-11: 100 mg via INTRAVENOUS

## 2019-10-11 MED ORDER — SODIUM CHLORIDE 0.9 % IV SOLN
INTRAVENOUS | Status: DC | PRN
  Administered 2019-10-11: 87 mL

## 2019-10-11 MED ORDER — CIPROFLOXACIN IN D5W 400 MG/200ML IV SOLN
INTRAVENOUS | Status: DC | PRN
Start: 2019-10-11 — End: 2019-10-11
  Administered 2019-10-11: 400 mg via INTRAVENOUS

## 2019-10-11 MED ORDER — INDOMETHACIN 50 MG RE SUPP
RECTAL | Status: DC | PRN
  Administered 2019-10-11: 100 mg via RECTAL

## 2019-10-11 MED ORDER — DEXAMETHASONE SODIUM PHOSPHATE 10 MG/ML IJ SOLN
INTRAMUSCULAR | Status: DC | PRN
  Administered 2019-10-11: 10 mg via INTRAVENOUS

## 2019-10-11 MED ORDER — INDOMETHACIN 50 MG RE SUPP
RECTAL | Status: AC
  Filled 2019-10-11: qty 2

## 2019-10-11 MED ORDER — GLUCAGON HCL RDNA (DIAGNOSTIC) 1 MG IJ SOLR
INTRAMUSCULAR | Status: AC
  Filled 2019-10-11: qty 1

## 2019-10-11 MED ORDER — SODIUM CHLORIDE 0.9 % IV SOLN: INTRAVENOUS | Status: DC

## 2019-10-11 MED ORDER — PROPOFOL 10 MG/ML IV BOLUS
INTRAVENOUS | Status: AC
  Filled 2019-10-11: qty 20

## 2019-10-11 NOTE — Anesthesia Procedure Notes (Addendum)
Procedure Name: Intubation Date/Time: 10/11/2019 10:23 AM Performed by: Maxwell Caul, CRNA Pre-anesthesia Checklist: Patient identified, Emergency Drugs available, Suction available and Patient being monitored Patient Re-evaluated:Patient Re-evaluated prior to induction Oxygen Delivery Method: Circle system utilized Preoxygenation: Pre-oxygenation with 100% oxygen Induction Type: IV induction Ventilation: Mask ventilation without difficulty Laryngoscope Size: Mac and 4 Grade View: Grade I Tube type: Oral Tube size: 7.5 mm Number of attempts: 1 Airway Equipment and Method: Stylet and Oral airway Placement Confirmation: ETT inserted through vocal cords under direct vision,  positive ETCO2 and breath sounds checked- equal and bilateral Secured at: 21 cm Tube secured with: Tape Dental Injury: Teeth and Oropharynx as per pre-operative assessment

## 2019-10-11 NOTE — Discharge Instructions (Signed)
Call if question or problem particularly pain fever signs of GI bleeding or yellow jaundice or nausea or vomiting otherwise clear liquid diet until 5 PM and if doing well may have soft solids this evening otherwise call in 1 week for pathology and we will decide how to proceed    YOU HAD AN ENDOSCOPIC PROCEDURE TODAY: Refer to the procedure report and other information in the discharge instructions given to you for any specific questions about what was found during the examination. If this information does not answer your questions, please call [facility] office at [phone] to clarify.   YOU SHOULD EXPECT: Some feelings of bloating in the abdomen. Passage of more gas than usual. Walking can help get rid of the air that was put into your GI tract during the procedure and reduce the bloating. If you had a lower endoscopy (such as a colonoscopy or flexible sigmoidoscopy) you may notice spotting of blood in your stool or on the toilet paper. Some abdominal soreness may be present for a day or two, also.    ACTIVITY: Your care partner should take you home directly after the procedure. You should plan to take it easy, moving slowly for the rest of the day. You can resume normal activity the day after the procedure however YOU SHOULD NOT DRIVE, use power tools, machinery or perform tasks that involve climbing or major physical exertion for 24 hours (because of the sedation medicines used during the test).   SYMPTOMS TO REPORT IMMEDIATELY: A gastroenterologist can be reached at any hour. Please call [phone]  for any of the following symptoms:    Following upper endoscopy (EGD, EUS, ERCP, esophageal dilation) Vomiting of blood or coffee ground material  New, significant abdominal pain  New, significant chest pain or pain under the shoulder blades  Painful or persistently difficult swallowing  New shortness of breath  Black, tarry-looking or red, bloody stools

## 2019-10-11 NOTE — Transfer of Care (Signed)
Immediate Anesthesia Transfer of Care Note  Patient: Jill Daniels  Procedure(s) Performed: ENDOSCOPIC RETROGRADE CHOLANGIOPANCREATOGRAPHY (ERCP) (N/A ) BILIARY BRUSHING  Patient Location: PACU  Anesthesia Type:General  Level of Consciousness: awake, alert  and oriented  Airway & Oxygen Therapy: Patient Spontanous Breathing and Patient connected to face mask oxygen  Post-op Assessment: Report given to RN and Post -op Vital signs reviewed and stable  Post vital signs: Reviewed and stable  Last Vitals:  Vitals Value Taken Time  BP 176/100 10/11/19 1140  Temp 36.5 C 10/11/19 1139  Pulse 73 10/11/19 1144  Resp 15 10/11/19 1144  SpO2 100 % 10/11/19 1144  Vitals shown include unvalidated device data.  Last Pain:  Vitals:   10/11/19 1139  TempSrc: Oral  PainSc: 0-No pain         Complications: No complications documented.

## 2019-10-11 NOTE — Progress Notes (Signed)
Jill Daniels 10:12 AM  Subjective: Patient without any current GI complaints and her history was reviewed and her case discussed with my partner Dr. Alessandra Bevels and her x-rays were reviewed and she has been trying to lose weight through weight watchers and other than an episode of questionable mild diverticulitis she has had no other GI complaints and got better on antibiotics and Gas-X and she has not had any other fever or any other complaints  Objective: Vital signs stable afebrile no acute distress exam please see preassessment evaluation x-rays and labs reviewed  Assessment: Abnormal MRCP  Plan: Okay to proceed with ERCP with anesthesia assistance and the risks benefits and success rate of the procedure was discussed again  [facility]  office [phone] After 5PM or if no answer call [phone]

## 2019-10-11 NOTE — Anesthesia Postprocedure Evaluation (Signed)
Anesthesia Post Note  Patient: Jill Daniels  Procedure(s) Performed: ENDOSCOPIC RETROGRADE CHOLANGIOPANCREATOGRAPHY (ERCP) (N/A ) BILIARY BRUSHING     Patient location during evaluation: PACU Anesthesia Type: General Level of consciousness: awake and alert Pain management: pain level controlled Vital Signs Assessment: post-procedure vital signs reviewed and stable Respiratory status: spontaneous breathing, nonlabored ventilation, respiratory function stable and patient connected to nasal cannula oxygen Cardiovascular status: blood pressure returned to baseline and stable Postop Assessment: no apparent nausea or vomiting Anesthetic complications: no   No complications documented.  Last Vitals:  Vitals:   10/11/19 1150 10/11/19 1200  BP: (!) 179/95 (!) 179/86  Pulse: 73 70  Resp: (!) 21 14  Temp:    SpO2: 98% 99%    Last Pain:  Vitals:   10/11/19 1200  TempSrc:   PainSc: 0-No pain                 Koby Hartfield S

## 2019-10-11 NOTE — Op Note (Signed)
[facility] Patient Name: Jill Daniels Procedure Date: 10/11/2019 MRN: [id number] Attending MD: Clarene Essex , MD Date of Birth: 22-Apr-1955 CSN: [id number] Age: 64 Admit Type: Outpatient Procedure:                ERCP Indications:              Abnormal MRCP and CT for isolated left biliary                            stricture and segment dilation Providers:                Clarene Essex, MD, Benetta Spar RN, RN, Cherylynn Ridges, Technician, Virgia Land, CRNA Referring MD:              Medicines:                General Anesthesia Complications:            No immediate complications. Estimated Blood Loss:     Estimated blood loss: none. Procedure:                Pre-Anesthesia Assessment:                           - Prior to the procedure, a History and Physical                            was performed, and patient medications and                            allergies were reviewed. The patient's tolerance of                            previous anesthesia was also reviewed. The risks                            and benefits of the procedure and the sedation                            options and risks were discussed with the patient.                            All questions were answered, and informed consent                            was obtained. Prior Anticoagulants: The patient has                            taken no previous anticoagulant or antiplatelet                            agents. ASA Grade Assessment: II - A patient with  mild systemic disease. After reviewing the risks                            and benefits, the patient was deemed in                            satisfactory condition to undergo the procedure.                           After obtaining informed consent, the scope was                            passed under direct vision. Throughout the                            procedure, the patient's blood  pressure, pulse, and                            oxygen saturations were monitored continuously. The                            TJF-Q180V ([id number]) Olympus Doudenoscope was                            introduced through the mouth, and used to inject                            contrast into and used to cannulate the bile duct.                            The ERCP was accomplished without difficulty. The                            patient tolerated the procedure well. Scope In: Scope Out: Findings:      The major papilla was normal. Deep selective cannulation was obtained       after initially proceeding with a precut sphincterotomy and then once       obtained a complete biliary sphincterotomy was made with a Hydratome       sphincterotome using ERBE electrocautery. There was no       post-sphincterotomy bleeding. There was adequate biliary drainage and we       could get the fully bowed sphincterotome easily in and out of the duct.       There was no pancreatic duct wire advancement or injection throughout       the procedure and we elected to proceed with the precut when we felt we       had the catheter in the proper location but could not advance the wire       and only had a precut a short ways on initial cholangiogram the left       intrahepatic branches contained localized stenoses. We did place the       wire into 1 left branch fairly readily and to discover objects, the       biliary tree was swept with an adjustable 9- 12 mm balloon  starting at       the bifurcation and left main hepatic duct. Occlusion cholangiogram was       also done in the left main as above to better delineate the anatomy and       on one balloon pull-through nothing was found. Cells for cytology were       obtained by brushing in the left intrahepatic branches of the initial       branch we ran which did not fill in Dr. Barnet Glasgow of radiology was       asked to come evaluate the fluoroscopy images and  compared to the CT and       he thought the dilated segment was just below where we were brushing and       the wire was repositioned then advanced into this dilated segment and       some brushings were taken in the customary fashion of this area as well       and we elected to stop the procedure at this point unsure if we could       advance spyglass far enough to be helpful and multiple occlusion       cholangiograms were done as mentioned above and there was adequate       biliary drainage except for slightly sluggish from this segment and the       patient tolerated the procedure well Impression:               - The major papilla appeared normal.                           - Localized biliary strictures were found in the                            left intrahepatic branches. The strictures were                            indeterminate. This stricture was evaluated with                            biliary sphincterotomy occlusion cholangiogram and                            brushing.                           - A sphincterotomy was performed.                           - The biliary tree was swept and nothing was found                            in the CBD.                           - Cells for cytology obtained in the left                            intrahepatic branches. And no pancreatic duct  injection or wire advancement throughout the                            procedure Moderate Sedation:      Not Applicable - Patient had care per Anesthesia. Recommendation:           - Clear liquid diet for 6 hours. If doing well may                            have soft solids at 5 PM                           - Continue present medications.                           - Return to GI clinic PRN.                           - Telephone GI clinic for pathology results in 1                            week.                           - Telephone GI clinic if symptomatic PRN.                            - Cipro (ciprofloxacin) 500 mg PO BID for 3 days. Procedure Code(s):        --- Professional ---                           [phone], Endoscopic retrograde                            cholangiopancreatography (ERCP); with                            sphincterotomy/papillotomy Diagnosis Code(s):        --- Professional ---                           K83.1, Obstruction of bile duct                           R93.2, Abnormal findings on diagnostic imaging of                            liver and biliary tract CPT copyright 2019 [employer]. All rights reserved. The codes documented in this report are preliminary and upon coder review may  be revised to meet current compliance requirements. Clarene Essex, MD 10/11/2019 11:43:46 AM This report has been signed electronically. Number of Addenda: 0

## 2019-10-12 ENCOUNTER — Telehealth: Payer: Self-pay | Admitting: Cardiology

## 2019-10-12 ENCOUNTER — Encounter (HOSPITAL_COMMUNITY): Payer: Self-pay | Admitting: Gastroenterology

## 2019-10-12 LAB — CYTOLOGY - NON PAP

## 2019-10-12 NOTE — Telephone Encounter (Signed)
Patient following up on mychart message that was sent. She wants to know if she should wait til after her stress test to schedule her virtual appt with Dr. Radford Pax. Please advise.

## 2019-10-13 ENCOUNTER — Telehealth: Payer: BC Managed Care – PPO | Admitting: Cardiology

## 2019-10-15 ENCOUNTER — Telehealth (HOSPITAL_COMMUNITY): Payer: Self-pay | Admitting: *Deleted

## 2019-10-15 ENCOUNTER — Encounter (HOSPITAL_COMMUNITY): Payer: Self-pay | Admitting: *Deleted

## 2019-10-15 NOTE — Telephone Encounter (Signed)
Sent instructions for stress test via My Chart.

## 2019-10-19 ENCOUNTER — Other Ambulatory Visit: Payer: Self-pay

## 2019-10-19 ENCOUNTER — Ambulatory Visit (HOSPITAL_COMMUNITY): Payer: BC Managed Care – PPO | Attending: Cardiology

## 2019-10-19 DIAGNOSIS — I2510 Atherosclerotic heart disease of native coronary artery without angina pectoris: Secondary | ICD-10-CM

## 2019-10-19 DIAGNOSIS — I2584 Coronary atherosclerosis due to calcified coronary lesion: Secondary | ICD-10-CM | POA: Diagnosis not present

## 2019-10-19 MED ORDER — TECHNETIUM TC 99M TETROFOSMIN IV KIT
31.5000 | PACK | Freq: Once | INTRAVENOUS | Status: AC | PRN
  Administered 2019-10-19: 31.5 via INTRAVENOUS
  Filled 2019-10-19: qty 32

## 2019-10-20 ENCOUNTER — Ambulatory Visit (HOSPITAL_COMMUNITY): Payer: BC Managed Care – PPO | Attending: Cardiovascular Disease

## 2019-10-20 LAB — MYOCARDIAL PERFUSION IMAGING
Estimated workload: 4.1 METS
Exercise duration (min): 5 min
Exercise duration (sec): 9 s
LV dias vol: 71 mL (ref 46–106)
LV sys vol: 36 mL
MPHR: 159 {beats}/min
Peak HR: 173 {beats}/min
Percent HR: 108 %
Rest HR: 83 {beats}/min
SDS: 0
SRS: 0
SSS: 0
TID: 0.93

## 2019-10-20 MED ORDER — TECHNETIUM TC 99M TETROFOSMIN IV KIT
31.2000 | PACK | Freq: Once | INTRAVENOUS | Status: AC | PRN
  Administered 2019-10-20: 31.2 via INTRAVENOUS
  Filled 2019-10-20: qty 32

## 2019-10-29 ENCOUNTER — Other Ambulatory Visit: Payer: Self-pay

## 2019-10-29 ENCOUNTER — Other Ambulatory Visit: Payer: BC Managed Care – PPO | Admitting: *Deleted

## 2019-10-29 DIAGNOSIS — E7800 Pure hypercholesterolemia, unspecified: Secondary | ICD-10-CM

## 2019-10-29 LAB — LIPID PANEL
Chol/HDL Ratio: 2.6 ratio (ref 0.0–4.4)
Cholesterol, Total: 154 mg/dL (ref 100–199)
HDL: 59 mg/dL (ref >39)
LDL Chol Calc (NIH): 82 mg/dL (ref 0–99)
Triglycerides: 62 mg/dL (ref 0–149)
VLDL Cholesterol Cal: 13 mg/dL (ref 5–40)

## 2019-10-29 LAB — ALT: ALT: 24 IU/L (ref 0–32)

## 2019-11-01 ENCOUNTER — Telehealth: Payer: Self-pay

## 2019-11-01 MED ORDER — ATORVASTATIN CALCIUM 80 MG PO TABS
80.0000 mg | ORAL_TABLET | Freq: Every day | ORAL | 3 refills | Status: AC
Start: 2019-11-01 — End: ?

## 2019-11-01 NOTE — Telephone Encounter (Signed)
-----   Message from Sueanne Margarita, MD sent at 10/30/2019 12:52 PM EDT ----- Increase atorvastatin to 80mg  daily to get LDL under 70 and repeat FLP and ALT in 6 weeks

## 2019-11-01 NOTE — Telephone Encounter (Signed)
The patient has been notified of the result and verbalized understanding.  All questions (if any) were answered. Jill Iba, RN 11/01/2019 5:29 PM  Rx sent for atorvastatin 80 mg. Patient is moving to [facility] 07/20 so will need to FU with lab work there.

## 2019-11-09 ENCOUNTER — Telehealth (INDEPENDENT_AMBULATORY_CARE_PROVIDER_SITE_OTHER): Payer: BC Managed Care – PPO | Admitting: Cardiology

## 2019-11-09 ENCOUNTER — Other Ambulatory Visit: Payer: Self-pay

## 2019-11-09 ENCOUNTER — Encounter: Payer: Self-pay | Admitting: Cardiology

## 2019-11-09 VITALS — BP 105/73 | HR 73 | Temp 96.3°F | Ht 67.5 in | Wt 282.4 lb

## 2019-11-09 DIAGNOSIS — E7800 Pure hypercholesterolemia, unspecified: Secondary | ICD-10-CM

## 2019-11-09 DIAGNOSIS — I10 Essential (primary) hypertension: Secondary | ICD-10-CM | POA: Insufficient documentation

## 2019-11-09 DIAGNOSIS — I2510 Atherosclerotic heart disease of native coronary artery without angina pectoris: Secondary | ICD-10-CM

## 2019-11-09 DIAGNOSIS — R931 Abnormal findings on diagnostic imaging of heart and coronary circulation: Secondary | ICD-10-CM

## 2019-11-09 DIAGNOSIS — I2584 Coronary atherosclerosis due to calcified coronary lesion: Secondary | ICD-10-CM

## 2019-11-09 HISTORY — DX: Abnormal findings on diagnostic imaging of heart and coronary circulation: R93.1

## 2019-11-09 HISTORY — DX: Morbid (severe) obesity due to excess calories: E66.01

## 2019-11-09 NOTE — Patient Instructions (Signed)
Medication Instructions:  Your physician recommends that you continue on your current medications as directed. Please refer to the Current Medication list given to you today.  *If you need a refill on your cardiac medications before your next appointment, please call your pharmacy*   Lab Work: Fasting lipids and ALT in 6 weeks at PCP.   If you have labs (blood work) drawn today and your tests are completely normal, you will receive your results only by: Marland Kitchen MyChart Message (if you have MyChart) OR . A paper copy in the mail If you have any lab test that is abnormal or we need to change your treatment, we will call you to review the results.  Follow-Up: At [facility], you and your health needs are our priority.  As part of our continuing mission to provide you with exceptional heart care, we have created designated Provider Care Teams.  These Care Teams include your primary Cardiologist (physician) and Advanced Practice Providers (APPs -  Physician Assistants and Nurse Practitioners) who all work together to provide you with the care you need, when you need it.  Follow up with Dr. Radford Pax as needed.

## 2019-11-09 NOTE — Progress Notes (Signed)
Virtual Visit via Telephone Note   This visit type was conducted due to national recommendations for restrictions regarding the COVID-19 Pandemic (e.g. social distancing) in an effort to limit this patient's exposure and mitigate transmission in our community.  Due to her co-morbid illnesses, this patient is at least at moderate risk for complications without adequate follow up.  This format is felt to be most appropriate for this patient at this time.  The patient did not have access to video technology/had technical difficulties with video requiring transitioning to audio format only (telephone).  All issues noted in this document were discussed and addressed.  No physical exam could be performed with this format.  Please refer to the patient's chart for her  consent to telehealth for [facility].   Evaluation Performed:  Follow-up visit  This visit type was conducted due to national recommendations for restrictions regarding the COVID-19 Pandemic (e.g. social distancing).  This format is felt to be most appropriate for this patient at this time.  All issues noted in this document were discussed and addressed.  No physical exam was performed (except for noted visual exam findings with Video Visits).  Please refer to the patient's chart (MyChart message for video visits and phone note for telephone visits) for the patient's consent to telehealth for [facility].  Date:  11/09/2019   ID:  Jill Daniels, DOB 1955-05-09, MRN [id number]  Patient Location:  [facility]  Provider location:   [place]  PCP:  Cari Caraway, MD  Cardiologist:  Fransico Him, MD  Electrophysiologist:  None   Chief Complaint:  Coronary artery calcifications  History of Present Illness:    Jill Daniels is a 64 y.o. female who presents via audio/video conferencing for a telehealth visit today.    This is a 64yo female with a hx of morbid obesity, HLD and fm hx of CAD who had a abdominal  CT done for workup of  abdominal pain and CT showed  for coronary artery calcifications in the LCX and RCA and aortic atherosclerosis.  She has been completely asymptomatic.  A coronary calcium score was done which showed a Ca score of 628 which was 99th% for age and sex matched control. Nuclear stress test showed normal LVF and no ischemia.  She is now here today to discuss the results of her studies.  She has moved to [place] recently and is going to find a Manufacturing engineer. She has started with Weight [employer] and has lost weight and is motivated to get into shape.  Her last HbA1C was 5.5%.    Prior CV studies:   The following studies were reviewed today:  Coronary Ca score and lexiscan myoview  Past Medical History:  Diagnosis Date  . Acute abscess of maxillary sinus   . Agatston coronary artery calcium score greater than 400 11/09/2019   Ca score of 628 which was 99th% for age and sex matched control.  . Allergic rhinitis   . Asthma   . BMI 50.0-59.9, adult ([place])   . Carcinoma of endometrium ([place])   . DDD (degenerative disc disease), lumbar   . Diverticulitis   . DVT (deep venous thrombosis) ([place])   . Dyslipidemia   . Food allergy    Fish Allergy  . History of partial colectomy   . Hypercholesteremia   . Hypercholesterolemia   . Morbid obesity ([place])   . Morbid obesity ([place]) 11/09/2019  . Osteoarthritis   . Osteoarthritis of hip, unspecified   . Personal history of  colonic polyps   . Skin lesions   . Uterine cancer ([place])   . Vitamin D deficiency    Past Surgical History:  Procedure Laterality Date  . APPENDECTOMY    . BILIARY BRUSHING  10/11/2019   Procedure: BILIARY BRUSHING;  Surgeon: Clarene Essex, MD;  Location: [facility];  Service: Endoscopy;;  . CHOLECYSTECTOMY    . COLON SURGERY    . ERCP N/A 10/11/2019   Procedure: ENDOSCOPIC RETROGRADE CHOLANGIOPANCREATOGRAPHY (ERCP);  Surgeon: Clarene Essex, MD;  Location: [facility];  Service: Endoscopy;  Laterality: N/A;  . OTHER SURGICAL HISTORY      Surgeries to remove scar tissue from ovarian cyst surgery  . OVARIAN CYST SURGERY    . ROBOTIC ASSISTED TOTAL HYSTERECTOMY    . SHOULDER SURGERY    . TUBAL LIGATION       Current Meds  Medication Sig  . atorvastatin (LIPITOR) 80 MG tablet Take 1 tablet (80 mg total) by mouth daily.  . Cholecalciferol (VITAMIN D) 50 MCG (2000 UT) tablet Take 2,000 Units by mouth daily.  Marland Kitchen EPINEPHrine (EPIPEN 2-PAK) 0.3 mg/0.3 mL IJ SOAJ injection Use as directed for life-threatening allergic reaction.  Marland Kitchen FLOVENT HFA 110 MCG/ACT inhaler Inhale 1 puff into the lungs 2 (two) times daily as needed.   . fluticasone (FLONASE) 50 MCG/ACT nasal spray Place 1 spray into both nostrils daily.   . Multiple Vitamin (MULTIVITAMIN) tablet Take 1 tablet by mouth daily.  . Probiotic Product (PROBIOTIC DAILY PO) Take 1 capsule by mouth daily.  Marland Kitchen PROVENTIL HFA 108 (90 Base) MCG/ACT inhaler Inhale 2 puffs into the lungs every 4 (four) hours as needed for wheezing or shortness of breath.      Allergies:   Fish-derived products, Penicillin g, Penicillins, Bee venom, Other, Clindamycin/lincomycin, Erythromycin, and Tape   Social History   Tobacco Use  . Smoking status: Never Smoker  . Smokeless tobacco: Never Used  Substance Use Topics  . Alcohol use: No  . Drug use: No     Family Hx: The patient's family history includes Asthma in her father and sister; CAD in her father; Heart attack in her mother; Lupus in her sister.  ROS:   Please see the history of present illness.     All other systems reviewed and are negative.   Labs/Other Tests and Data Reviewed:    Recent Labs: 10/29/2019: ALT 24   Recent Lipid Panel Lab Results  Component Value Date/Time   CHOL 154 10/29/2019 09:13 AM   TRIG 62 10/29/2019 09:13 AM   HDL 59 10/29/2019 09:13 AM   CHOLHDL 2.6 10/29/2019 09:13 AM   LDLCALC 82 10/29/2019 09:13 AM    Wt Readings from Last 3 Encounters:  11/09/19 (!) 282 lb 6.4 oz (128.1 kg)  10/19/19 283 lb  (128.4 kg)  10/11/19 283 lb (128.4 kg)     Objective:    Vital Signs:  BP 105/73   Pulse 73   Temp (!) 96.3 F (35.7 C)   Ht 5' 7.5" (1.715 m)   Wt (!) 282 lb 6.4 oz (128.1 kg)   SpO2 98%   BMI 43.58 kg/m      ASSESSMENT & PLAN:    1.  Coronary artery calcifications -Ca score of 628 which was 99th% for age and sex matched control. -nuclear stress test showed no ischemia -she has not had any anginal symptoms -she needs aggressive risk factor modification -LDL was 82 last week and atorvastatin increased to 80mg  daily -she has an FL  and ALT pending in 6 weeks with PCP -I have encouraged her to establish with a Cardiologist in [place]  2.  HLD -LDL goal < 70 -her last LDL was 82 -atorvastatin increased last week  3.  Morbid Obesity -I have encouraged her to get into a routine exercise program and cut back on carbs and portions.    COVID-19 Education: The signs and symptoms of COVID-19 were discussed with the patient and how to seek care for testing (follow up with PCP or arrange E-visit).  The importance of social distancing was discussed today.  Patient Risk:   After full review of this patient's clinical status, I feel that they are at least moderate risk at this time.  Time:   Today, I have spent 20 minutes on telemedicine discussing medical problems including coronary artery calcifications, HTN and morbid obesity and reviewing patient's chart including coronary Ca score and lexi myoview.  Medication Adjustments/Labs and Tests Ordered: Current medicines are reviewed at length with the patient today.  Concerns regarding medicines are outlined above.  Tests Ordered: No orders of the defined types were placed in this encounter.  Medication Changes: No orders of the defined types were placed in this encounter.   Disposition:  Follow up PRN as she has moved to [facility], Fransico Him, MD  11/09/2019 10:17 AM    [place]

## 2020-07-14 IMAGING — MG DIGITAL SCREENING BILATERAL MAMMOGRAM WITH TOMO AND CAD
6 of 12 series · 6 of 36 positions shown · non-contrast
Comparison: Previous exam(s).

CLINICAL DATA: Screening.

EXAM:
DIGITAL SCREENING BILATERAL MAMMOGRAM WITH TOMO AND CAD

[L MLO synth-2D (1 of 2)]
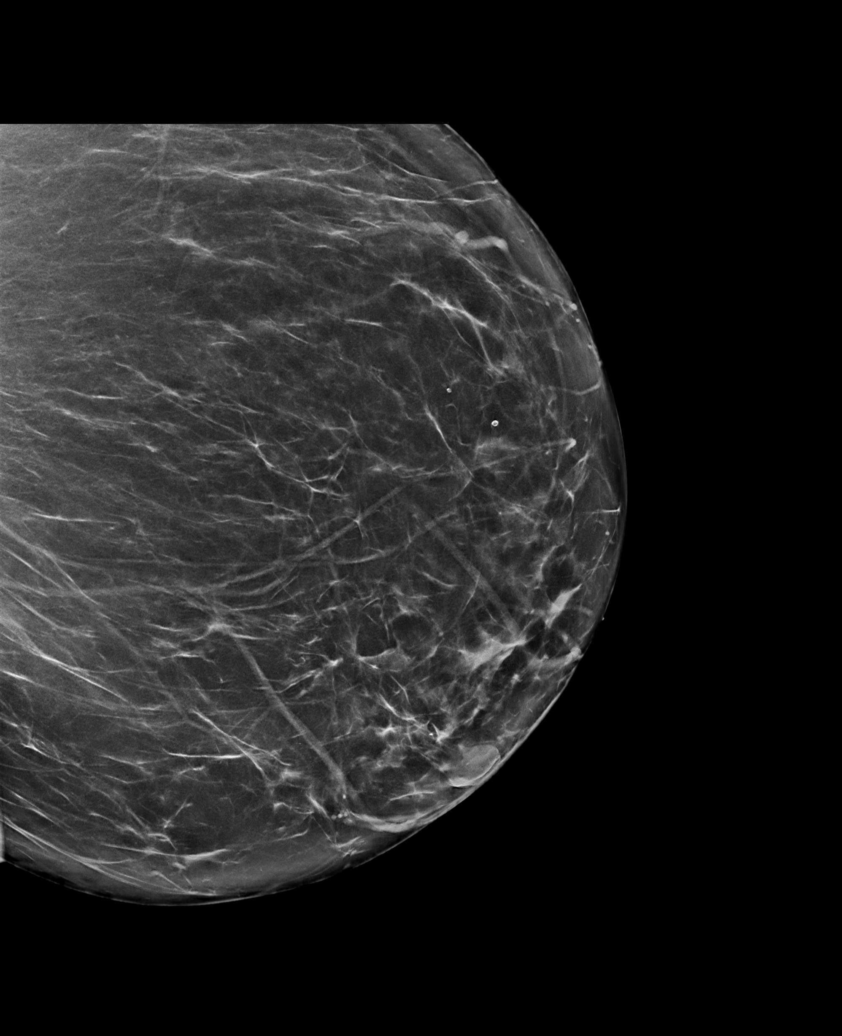

[R MLO synth-2D (1 of 2)]
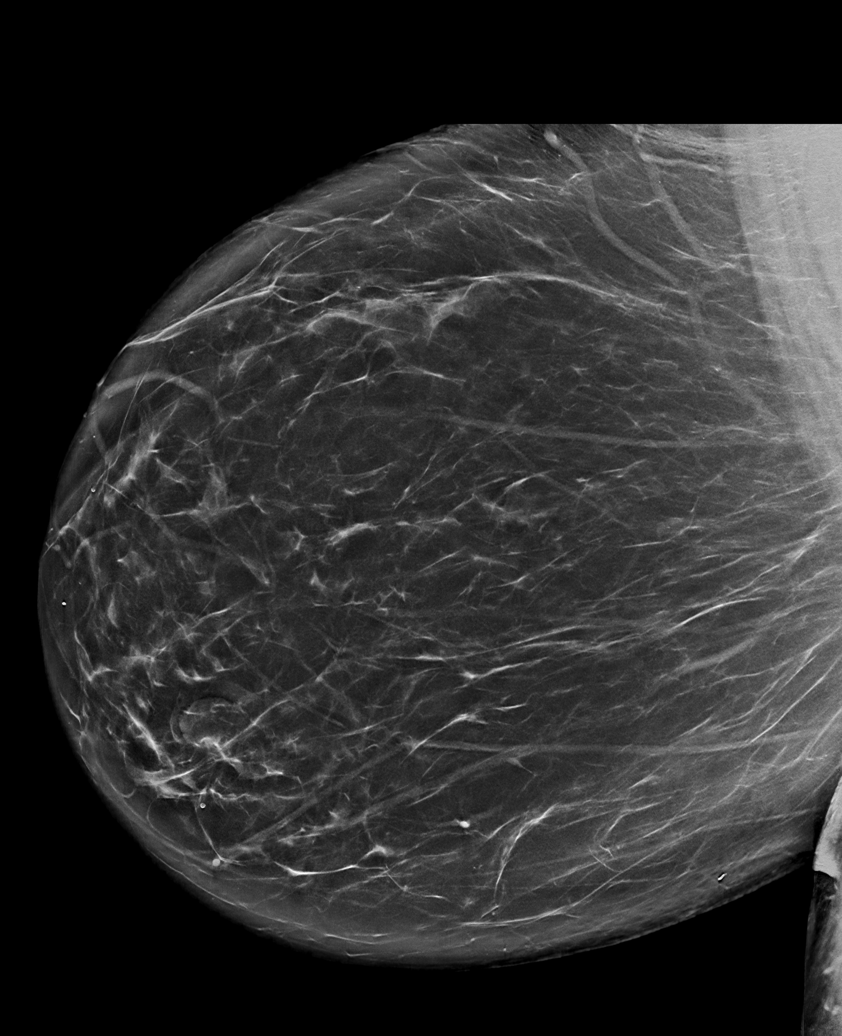

[R MLO synth-2D (2 of 2)]
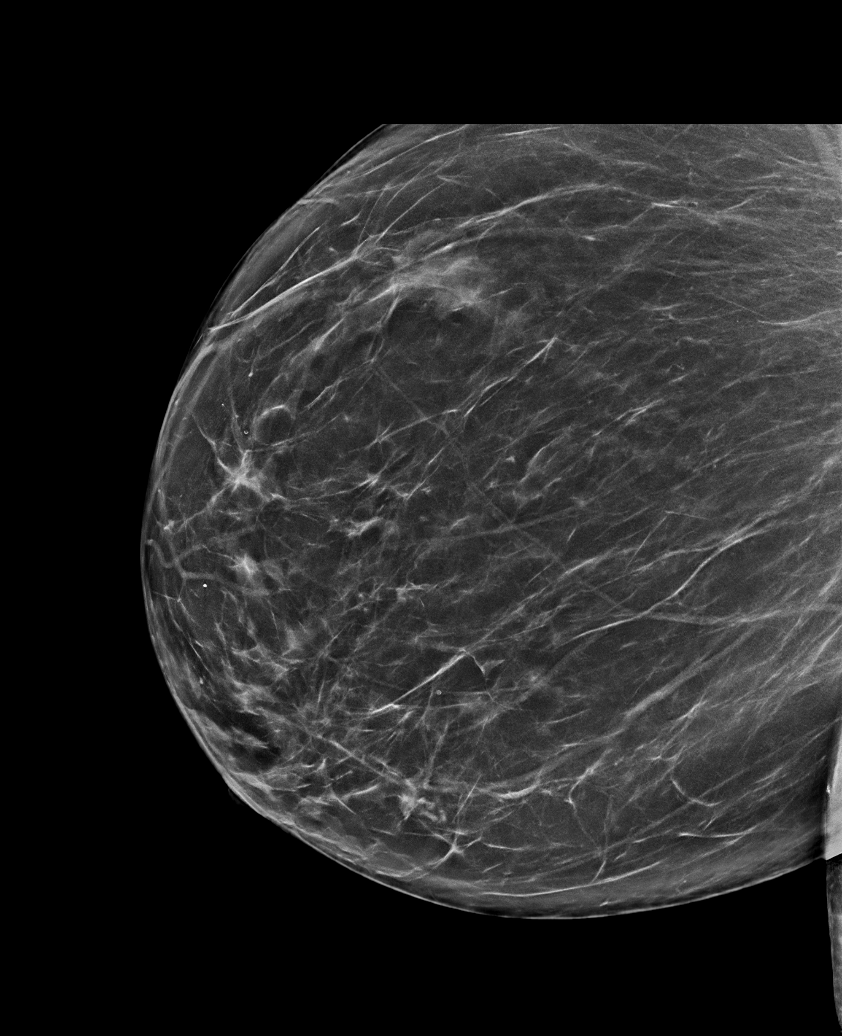

[L MLO synth-2D (2 of 2)]
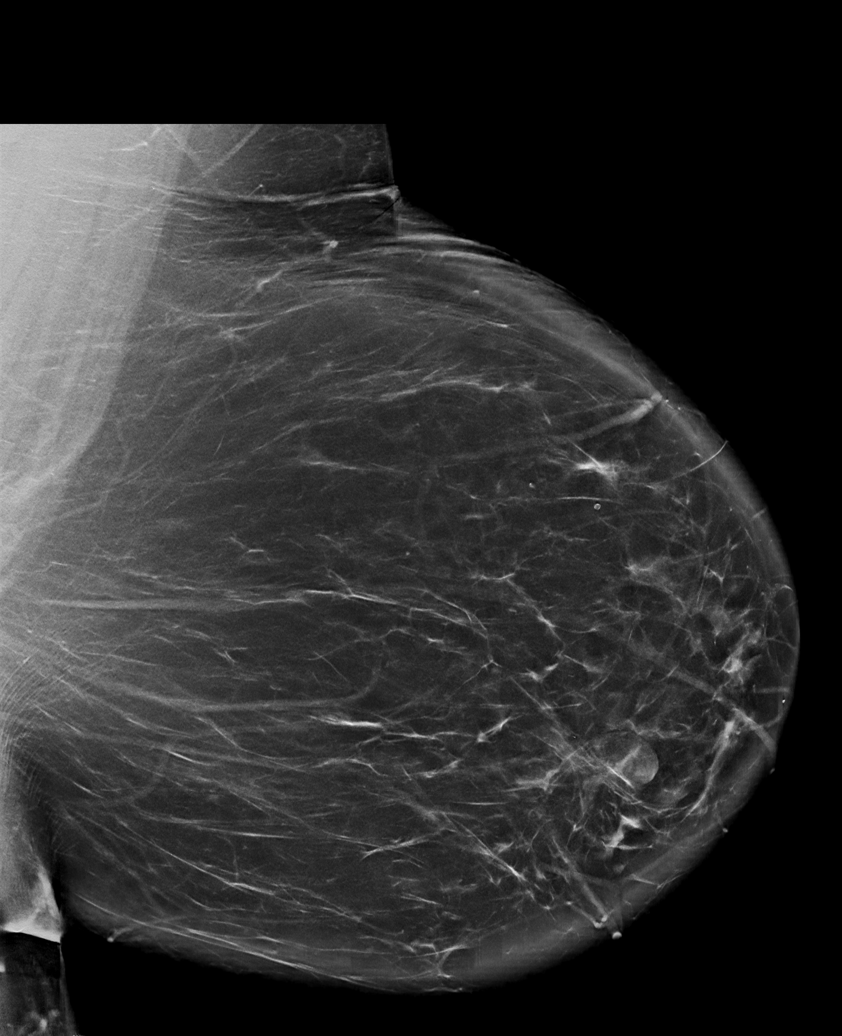

[R CC synth-2D]
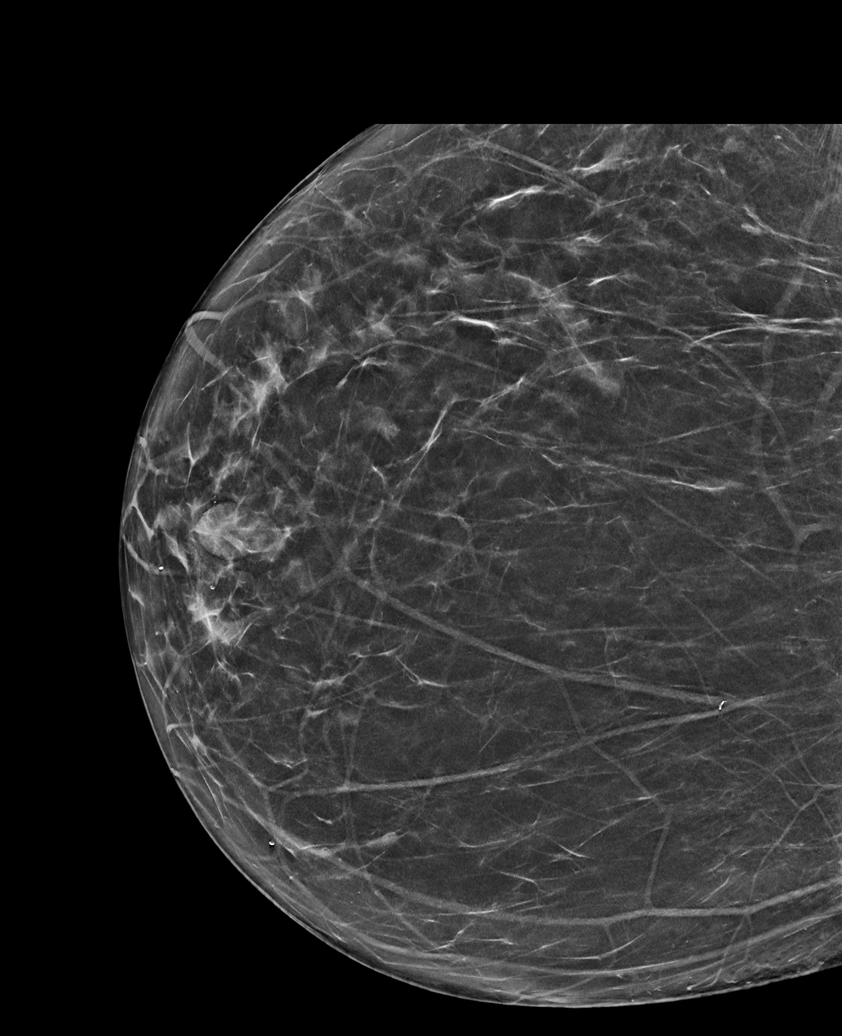

[L CC synth-2D]
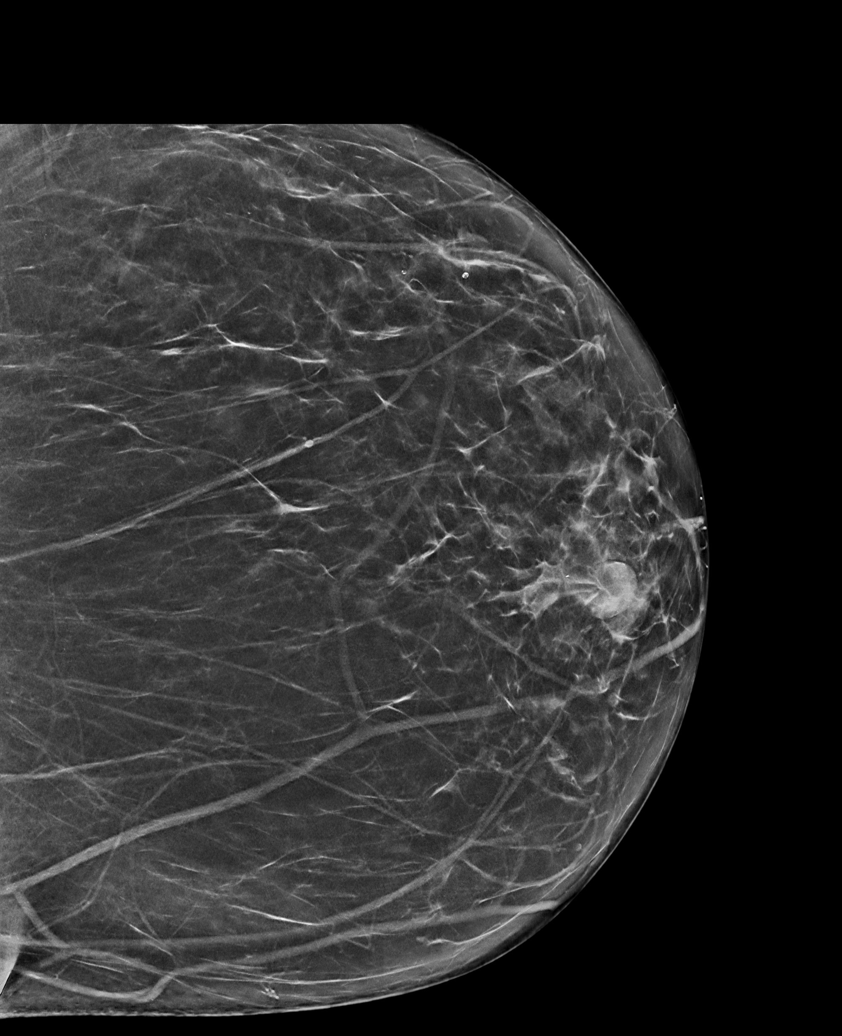

[6 of 36 positions shown; findings below may reference images not displayed]

ACR Breast Density Category b: There are scattered areas of
fibroglandular density.
FINDINGS: There are no findings suspicious for malignancy. Images were
processed with CAD.
IMPRESSION: No mammographic evidence of malignancy. A result letter of this
screening mammogram will be mailed directly to the patient.

RECOMMENDATION:
Screening mammogram in one year. (Code:[id number])

BI-RADS CATEGORY  1: Negative.

## 2021-12-13 ENCOUNTER — Encounter (HOSPITAL_BASED_OUTPATIENT_CLINIC_OR_DEPARTMENT_OTHER): Payer: Self-pay | Admitting: Cardiology
